# Patient Record
Sex: Male | Born: 1995 | Race: White | Hispanic: No | Marital: Single | Smoking: Former smoker
Health system: Southern US, Community
[De-identification: ages and names within clinical notes are randomized; demographics above are authoritative.]

## PROBLEM LIST (undated history)

## (undated) DIAGNOSIS — F32A Depression, unspecified: Secondary | ICD-10-CM

## (undated) DIAGNOSIS — F909 Attention-deficit hyperactivity disorder, unspecified type: Secondary | ICD-10-CM

## (undated) DIAGNOSIS — F329 Major depressive disorder, single episode, unspecified: Secondary | ICD-10-CM

## (undated) HISTORY — PX: EYE SURGERY: SHX253

---
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
## 2016-05-25 ENCOUNTER — Emergency Department (HOSPITAL_COMMUNITY)
Admission: EM | Admit: 2016-05-25 | Discharge: 2016-05-25 | Disposition: A | Discharge status: Home / Self Care | Payer: Medicaid Other | Attending: Emergency Medicine | Admitting: Emergency Medicine

## 2016-05-25 ENCOUNTER — Encounter (HOSPITAL_COMMUNITY): Payer: Self-pay | Admitting: Emergency Medicine

## 2016-05-25 DIAGNOSIS — R55 Syncope and collapse: Secondary | ICD-10-CM | POA: Insufficient documentation

## 2016-05-25 DIAGNOSIS — R9431 Abnormal electrocardiogram [ECG] [EKG]: Secondary | ICD-10-CM | POA: Insufficient documentation

## 2016-05-25 DIAGNOSIS — Z79899 Other long term (current) drug therapy: Secondary | ICD-10-CM | POA: Insufficient documentation

## 2016-05-25 DIAGNOSIS — F909 Attention-deficit hyperactivity disorder, unspecified type: Secondary | ICD-10-CM | POA: Insufficient documentation

## 2016-05-25 DIAGNOSIS — J029 Acute pharyngitis, unspecified: Secondary | ICD-10-CM | POA: Insufficient documentation

## 2016-05-25 DIAGNOSIS — Z791 Long term (current) use of non-steroidal anti-inflammatories (NSAID): Secondary | ICD-10-CM | POA: Insufficient documentation

## 2016-05-25 LAB — RAPID STREP SCREEN (MED CTR MEBANE ONLY): Streptococcus, Group A Screen (Direct): NEGATIVE

## 2016-05-25 MED ORDER — DEXAMETHASONE 4 MG PO TABS
12.0000 mg | ORAL_TABLET | Freq: Once | ORAL | Status: AC
  Administered 2016-05-25: 12 mg via ORAL
  Filled 2016-05-25: qty 3

## 2016-05-25 NOTE — ED Triage Notes (Signed)
Mother states he has episodes of near passing out. Pt state he will feel weak and dizzy before it happens

## 2016-05-25 NOTE — ED Triage Notes (Signed)
Sore throat x 1 week

## 2016-05-25 NOTE — ED Provider Notes (Signed)
[facility]EMER[facility]Y DEPT Provider Note   CSN: [id number] Arrival date & time: 05/25/16  1436  By signing my name below, I, Placido SouLogan Joldersma, attest that this documentation has been prepared under the direction and in the presence of Raeford RazorStephen Oryon Gary, MD. Electronically Signed: Placido SouLogan Joldersma, ED Scribe. 05/25/16. 5:40 PM.   History   Chief Complaint Chief Complaint  Patient presents with  . Sore Throat  . Near Syncope    HPI HPI Comments: Patrick Preston is a 20 y.o. male who presents to the Emergency Department complaining of constant, mild, sore throat x 1 week. Per mother, he is experiencing white spots on his posterior oropharynx, subjective fever, chills, mild productive cough with blood speckled sputum, as well as a moderate, diffuse, HA x 3 days. His sore throat worsens when swallowing. She states he was a h/o of dizziness with associated visual changes and diaphoresis when abruptly standing and will "lean against the wall for a minute until I get better". He states they are near syncopal and they are more frequent since the onset of his sore throat. He has taken tylenol, Advil and Delsym which provide mild short term relief of his symptoms. He denies appetite changes, nausea, vomiting, CP, back pain and ear pain.   The history is provided by the patient and a parent. No language interpreter was used.    Past Medical History:  Diagnosis Date  . ADHD (attention deficit hyperactivity disorder)   . Depression     There are no active problems to display for this patient.   Past Surgical History:  Procedure Laterality Date  . EYE SURGERY         Home Medications    Prior to Admission medications   Medication Sig Start Date End Date Taking? Authorizing Provider  amoxicillin (AMOXIL) 500 MG capsule Take 1 capsule (500 mg total) by mouth 3 (three) times daily. Patient not taking: Reported on 07/31/2014 06/29/13   Ivery QualeHobson Bryant, PA-C  ibuprofen (ADVIL,MOTRIN) 600 MG tablet Take 1  tablet (600 mg total) by mouth every 6 (six) hours as needed. 10/30/14   Ivery QualeHobson Bryant, PA-C  methylphenidate (CONCERTA) 36 MG CR tablet Take 36 mg by mouth every morning.     Historical Provider, MD  methylphenidate (CONCERTA) 54 MG CR tablet Take 54 mg by mouth every morning.    Historical Provider, MD  sulfamethoxazole-trimethoprim (BACTRIM DS,SEPTRA DS) 800-160 MG per tablet Take 1 tablet by mouth 2 (two) times daily.    Historical Provider, MD    Family History Family History  Problem Relation Age of Onset  . Rheum arthritis Mother   . Hypertension Mother   . Hyperlipidemia Mother   . Heart failure Other   . Diabetes Other   . Hypertension Other     Social History Social History  Substance Use Topics  . Smoking status: Never Smoker  . Smokeless tobacco: Never Used  . Alcohol use No     Allergies   Patient has no known allergies.   Review of Systems Review of Systems  Constitutional: Positive for chills, diaphoresis, fatigue and fever. Negative for appetite change.  HENT: Positive for sore throat. Negative for ear pain.   Eyes: Positive for visual disturbance.  Respiratory: Positive for cough.   Cardiovascular: Negative for chest pain.  Gastrointestinal: Negative for nausea and vomiting.  Musculoskeletal: Negative for back pain.  Neurological: Positive for dizziness and headaches. Negative for syncope.  All other systems reviewed and are negative.  Physical Exam Updated Vital Signs  BP 118/77   Pulse 87   Temp 98.9 F (37.2 C) (Oral)   Resp 14   Ht 6\' 2"  (1.88 m)   Wt 152 lb (68.9 kg)   SpO2 99%   BMI 19.52 kg/m   Physical Exam  Constitutional: He is oriented to person, place, and time. He appears well-developed and well-nourished.  HENT:  Head: Normocephalic and atraumatic.  Mouth/Throat: Uvula is midline. Tonsillar exudate.  Bilateral tonsillitis noted with exudates. Normal phonation.   Eyes: EOM are normal.  Neck: Normal range of motion. Neck supple.   Neck is supple w/o palpable nodes.   Cardiovascular: Normal rate, regular rhythm, normal heart sounds and intact distal pulses.   Pulmonary/Chest: Effort normal and breath sounds normal. No respiratory distress.  Abdominal: Soft. He exhibits no distension. There is no tenderness.  Musculoskeletal: Normal range of motion.  Lymphadenopathy:    He has no cervical adenopathy.  Neurological: He is alert and oriented to person, place, and time.  Skin: Skin is warm and dry.  Psychiatric: He has a normal mood and affect. Judgment normal.  Nursing note and vitals reviewed.  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 Confirmed by Juleen ChinaKOHUT  MD, Jeannett SeniorSTEPHEN [phone]([id number]) on 05/25/2016 5:56:22 PM       Radiology No results found.  Procedures Procedures  DIAGNOSTIC STUDIES: Oxygen Saturation is 99% on RA, normal by my interpretation.    COORDINATION OF CARE: 5:40 PM Discussed next steps with pt. Pt verbalized understanding and is agreeable with the plan.    Medications Ordered in ED Medications - No data to display   Initial Impression / Assessment and Plan / ED Course  I have reviewed the triage vital signs and the nursing notes.  Pertinent labs & imaging results that were available during my care of the patient were reviewed by me and considered in my medical decision making (see chart for details).  Clinical Course     20yM with exudative pharyngitis. Near syncopal symptoms. Seems most consistent with orthostasis in setting of  poor PO intake because of the sore throat.    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 No syncopal events. Currently no complaints aside from the sore throat. Discussed with cardiology. Will have him follow-up with cardiology/EP.   Final Clinical Impressions(s) / ED Diagnoses   Final diagnoses:  Pharyngitis, unspecified etiology  Near syncope  Abnormal EKG    New Prescriptions New Prescriptions   No medications on file     Raeford RazorStephen Marlyne Totaro, MD 06/04/16 1436

## 2016-05-28 LAB — CULTURE, GROUP A STREP (THRC)

## 2016-06-09 ENCOUNTER — Encounter: Payer: Self-pay | Admitting: Internal Medicine

## 2016-07-01 ENCOUNTER — Emergency Department (HOSPITAL_COMMUNITY)
Admission: EM | Admit: 2016-07-01 | Discharge: 2016-07-01 | Disposition: A | Discharge status: Home / Self Care | Payer: Self-pay | Attending: Emergency Medicine | Admitting: Emergency Medicine

## 2016-07-01 ENCOUNTER — Encounter (HOSPITAL_COMMUNITY): Payer: Self-pay | Admitting: *Deleted

## 2016-07-01 DIAGNOSIS — Y999 Unspecified external cause status: Secondary | ICD-10-CM | POA: Insufficient documentation

## 2016-07-01 DIAGNOSIS — Z79899 Other long term (current) drug therapy: Secondary | ICD-10-CM | POA: Insufficient documentation

## 2016-07-01 DIAGNOSIS — F909 Attention-deficit hyperactivity disorder, unspecified type: Secondary | ICD-10-CM | POA: Insufficient documentation

## 2016-07-01 DIAGNOSIS — Z791 Long term (current) use of non-steroidal anti-inflammatories (NSAID): Secondary | ICD-10-CM | POA: Insufficient documentation

## 2016-07-01 DIAGNOSIS — X788XXA Intentional self-harm by other sharp object, initial encounter: Secondary | ICD-10-CM | POA: Insufficient documentation

## 2016-07-01 DIAGNOSIS — Z7289 Other problems related to lifestyle: Secondary | ICD-10-CM

## 2016-07-01 DIAGNOSIS — Y939 Activity, unspecified: Secondary | ICD-10-CM | POA: Insufficient documentation

## 2016-07-01 DIAGNOSIS — S71112A Laceration without foreign body, left thigh, initial encounter: Secondary | ICD-10-CM | POA: Insufficient documentation

## 2016-07-01 DIAGNOSIS — Y929 Unspecified place or not applicable: Secondary | ICD-10-CM | POA: Insufficient documentation

## 2016-07-01 MED ORDER — LIDOCAINE-EPINEPHRINE (PF) 1 %-1:200000 IJ SOLN
INTRAMUSCULAR | Status: AC
  Filled 2016-07-01: qty 30

## 2016-07-01 NOTE — ED Triage Notes (Signed)
Pt has laceration to left upper thigh due to self inflicted wound with a razor blade; pt states it helps him feel better when he cuts himself

## 2016-07-01 NOTE — Discharge Instructions (Signed)
Suture removal in 10 days

## 2016-07-01 NOTE — ED Provider Notes (Signed)
[facility] Provider Note   CSN: [id number] Arrival date & time: 07/01/16  0105     History   Chief Complaint Chief Complaint  Patient presents with  . Laceration    HPI Patrick Preston is a 20 y.o. male.  HPI Patient presents with self-inflicted laceration of his anterior left thigh.  He states he's had a history of cutting behavior before in the past.  He is not currently seeing a psychiatrist or therapist.  No thoughts of self-harm.  He reports he was sad and tearful and this is what he does sometimes when he feels this way.  He's never tried to purposely harm himself.  He's cut his arms before in the past as well.  He reports this cut went deeper than he has intended   Past Medical History:  Diagnosis Date  . ADHD (attention deficit hyperactivity disorder)   . Depression     There are no active problems to display for this patient.   Past Surgical History:  Procedure Laterality Date  . EYE SURGERY         Home Medications    Prior to Admission medications   Medication Sig Start Date End Date Taking? Authorizing Provider  acetaminophen (TYLENOL) 500 MG tablet Take 500 mg by mouth every 6 (six) hours as needed for moderate pain.    Historical Provider, MD  dextromethorphan (DELSYM) 30 MG/5ML liquid Take 30 mg by mouth as needed for cough.    Historical Provider, MD  ibuprofen (ADVIL,MOTRIN) 600 MG tablet Take 1 tablet (600 mg total) by mouth every 6 (six) hours as needed. 10/30/14   Ivery QualeHobson Bryant, PA-C    Family History Family History  Problem Relation Age of Onset  . Rheum arthritis Mother   . Hypertension Mother   . Hyperlipidemia Mother   . Heart failure Other   . Diabetes Other   . Hypertension Other     Social History Social History  Substance Use Topics  . Smoking status: Never Smoker  . Smokeless tobacco: Never Used  . Alcohol use No     Allergies   Patient has no known allergies.   Review of Systems Review of Systems  All  other systems reviewed and are negative.    Physical Exam Updated Vital Signs BP 143/75 (BP Location: Right Arm)   Pulse 72   Temp 98.2 F (36.8 C) (Oral)   Resp 20   Ht 6\' 2"  (1.88 m)   Wt 139 lb (63 kg)   SpO2 100%   BMI 17.85 kg/m   Physical Exam  Constitutional: He is oriented to person, place, and time. He appears well-developed and well-nourished.  HENT:  Head: Normocephalic.  Eyes: EOM are normal.  Neck: Normal range of motion.  Pulmonary/Chest: Effort normal.  Abdominal: He exhibits no distension.  Musculoskeletal: Normal range of motion.  Superficial laceration of the mid anterior left thigh without active bleeding.  No signs of infection.  Neurological: He is alert and oriented to person, place, and time.  Psychiatric: He has a normal mood and affect.  No suicidal or homicidal ideation  Nursing note and vitals reviewed.    ED Treatments / Results  Labs (all labs ordered are listed, but only abnormal results are displayed) Labs Reviewed - No data to display  EKG  EKG Interpretation None       Radiology No results found.  Procedures Procedures (including critical care time)  ++++++++++++++++++++++++++++++++++++++++++++++++  LACERATION REPAIR Performed by: Lyanne CoAMPOS,Lister Brizzi M Consent: Verbal  consent obtained. Risks and benefits: risks, benefits and alternatives were discussed Patient identity confirmed: provided demographic data Time out performed prior to procedure Prepped and Draped in normal sterile fashion Wound explored Laceration Location: left thigh Laceration Length: 5cm No Foreign Bodies seen or palpated Anesthesia: local infiltration Local anesthetic: lidocaine 2% with epinephrine Anesthetic total: 5 ml Irrigation method: syringe Amount of cleaning: standard Skin closure: 3-0 prolene Number of sutures or staples: 7 Technique: running interlocked Patient tolerance: Patient tolerated the procedure well with no immediate  complications.  +++++++++++++++++++++++++++++++++++++++++++++++++++++  Medications Ordered in ED Medications  lidocaine-EPINEPHrine (XYLOCAINE-EPINEPHrine) 1 %-1:200000 (PF) injection (not administered)     Initial Impression / Assessment and Plan / ED Course  I have reviewed the triage vital signs and the nursing notes.  Pertinent labs & imaging results that were available during my care of the patient were reviewed by me and considered in my medical decision making (see chart for details).  Clinical Course     Infection warnings. Laceration repaired. Here with mother. No HI or SI.  This is cutting behavior. He will need outpatient mental health follow up  Final Clinical Impressions(s) / ED Diagnoses   Final diagnoses:  Laceration of left thigh, initial encounter  Deliberate self-cutting    New Prescriptions New Prescriptions   No medications on file     Azalia BilisKevin Elenor Wildes, MD 07/01/16 [phone]

## 2018-11-14 ENCOUNTER — Encounter (HOSPITAL_COMMUNITY): Payer: Self-pay

## 2018-11-14 ENCOUNTER — Other Ambulatory Visit: Payer: Self-pay

## 2018-11-14 ENCOUNTER — Emergency Department (HOSPITAL_COMMUNITY): Payer: Self-pay

## 2018-11-14 ENCOUNTER — Emergency Department (HOSPITAL_COMMUNITY)
Admission: EM | Admit: 2018-11-14 | Discharge: 2018-11-14 | Disposition: A | Discharge status: Home / Self Care | Payer: Self-pay | Attending: Emergency Medicine | Admitting: Emergency Medicine

## 2018-11-14 DIAGNOSIS — S51811A Laceration without foreign body of right forearm, initial encounter: Secondary | ICD-10-CM | POA: Insufficient documentation

## 2018-11-14 DIAGNOSIS — Y939 Activity, unspecified: Secondary | ICD-10-CM | POA: Insufficient documentation

## 2018-11-14 DIAGNOSIS — W25XXXA Contact with sharp glass, initial encounter: Secondary | ICD-10-CM | POA: Insufficient documentation

## 2018-11-14 DIAGNOSIS — Y929 Unspecified place or not applicable: Secondary | ICD-10-CM | POA: Insufficient documentation

## 2018-11-14 DIAGNOSIS — F17210 Nicotine dependence, cigarettes, uncomplicated: Secondary | ICD-10-CM | POA: Insufficient documentation

## 2018-11-14 DIAGNOSIS — Z23 Encounter for immunization: Secondary | ICD-10-CM | POA: Insufficient documentation

## 2018-11-14 DIAGNOSIS — Y999 Unspecified external cause status: Secondary | ICD-10-CM | POA: Insufficient documentation

## 2018-11-14 MED ORDER — TETANUS-DIPHTH-ACELL PERTUSSIS 5-2.5-18.5 LF-MCG/0.5 IM SUSP
0.5000 mL | Freq: Once | INTRAMUSCULAR | Status: AC
Start: 2018-11-14 — End: 2018-11-14
  Administered 2018-11-14: 0.5 mL via INTRAMUSCULAR
  Filled 2018-11-14: qty 0.5

## 2018-11-14 MED ORDER — POVIDONE-IODINE 10 % EX SOLN
CUTANEOUS | Status: AC
  Filled 2018-11-14: qty 15

## 2018-11-14 MED ORDER — LIDOCAINE HCL (PF) 1 % IJ SOLN
5.0000 mL | Freq: Once | INTRAMUSCULAR | Status: AC
  Administered 2018-11-14: 5 mL

## 2018-11-14 MED ORDER — ONDANSETRON 4 MG PO TBDP
4.0000 mg | ORAL_TABLET | Freq: Once | ORAL | Status: AC
  Administered 2018-11-14: 4 mg via ORAL
  Filled 2018-11-14: qty 1

## 2018-11-14 MED ORDER — LIDOCAINE HCL (PF) 2 % IJ SOLN
INTRAMUSCULAR | Status: AC
  Filled 2018-11-14: qty 10

## 2018-11-14 MED ORDER — POVIDONE-IODINE 10 % EX SOLN
CUTANEOUS | Status: DC | PRN
  Administered 2018-11-14: 13:00:00 via TOPICAL

## 2018-11-14 NOTE — Discharge Instructions (Addendum)
Have your sutures removed in 10 days.  Keep your wound clean and dry,  Until a good scab forms - you may then wash gently twice daily with mild soap and water, but dry completely after.  Get rechecked for any sign of infection (redness,  Swelling,  Increased pain or drainage of purulent fluid). ° °

## 2018-11-14 NOTE — ED Provider Notes (Addendum)
[facility] EMERGENCY DEPARTMENT Provider Note   CSN: [id number] Arrival date & time: 11/14/18  1159    History   Chief Complaint Chief Complaint  Patient presents with  . Laceration    HPI Patrick Preston is a 23 y.o. male.     The history is provided by the patient.  Laceration  Location:  Shoulder/arm Shoulder/arm laceration location:  R forearm Depth:  Through underlying tissue Quality: jagged   Bleeding: controlled   Time since incident:  1 hour Laceration mechanism:  Broken glass (Pt woke to mother and boyfriend arguing.  He became angry and tried to stop the yelling by his punching a glass table.  Glass broke and he cut his right forearm. ) Foreign body present:  Unable to specify Relieved by:  Pressure Worsened by:  Movement Ineffective treatments:  None tried Tetanus status:  Unknown Associated symptoms: no fever, no focal weakness, no numbness and no swelling    Pt does have a history of depression and prior incidents of cutting, he adamantly denies this with todays incident.   Past Medical History:  Diagnosis Date  . ADHD (attention deficit hyperactivity disorder)   . Depression     There are no active problems to display for this patient.   Past Surgical History:  Procedure Laterality Date  . EYE SURGERY          Home Medications    Prior to Admission medications   Medication Sig Start Date End Date Taking? Authorizing Provider  acetaminophen (TYLENOL) 500 MG tablet Take 500 mg by mouth every 6 (six) hours as needed for moderate pain.    [provider]  dextromethorphan (DELSYM) 30 MG/5ML liquid Take 30 mg by mouth as needed for cough.    [provider]  ibuprofen (ADVIL,MOTRIN) 600 MG tablet Take 1 tablet (600 mg total) by mouth every 6 (six) hours as needed. 10/30/14   Ivery Quale, PA-C    Family History Family History  Problem Relation Age of Onset  . Rheum arthritis Mother   . Hypertension Mother   .  Hyperlipidemia Mother   . Heart failure Other   . Diabetes Other   . Hypertension Other     Social History Social History   Tobacco Use  . Smoking status: Current Every Day Smoker    Types: Cigarettes  . Smokeless tobacco: Never Used  Substance Use Topics  . Alcohol use: Yes  . Drug use: No     Allergies   Patient has no known allergies.   Review of Systems Review of Systems  Constitutional: Negative for chills and fever.  Respiratory: Negative for shortness of breath and wheezing.   Musculoskeletal: Negative for arthralgias and joint swelling.  Skin: Positive for wound.  Neurological: Negative for focal weakness and numbness.     Physical Exam Updated Vital Signs BP 110/73 (BP Location: Left Arm)   Pulse 65   Temp 98.6 F (37 C) (Oral)   Resp 12   Ht 6' 2.5" (1.892 m)   Wt 69.9 kg   SpO2 100%   BMI 19.51 kg/m   Physical Exam Constitutional:      Appearance: He is well-developed.  HENT:     Head: Normocephalic.  Cardiovascular:     Rate and Rhythm: Normal rate.  Pulmonary:     Effort: Pulmonary effort is normal.  Musculoskeletal:        General: No swelling, tenderness or deformity.     Comments: Pt has full strength  right forearm, wrist, fingers without deficit.  Proximal belly of muscle appreciated with flex/ext of the 3rd and 4th fingers, ? Palmaris longus which appears intact.   Skin:    Findings: Laceration present.     Comments: 2 cm laceration upper volarright forearm.  Hemostatic. Through dermis. Muscle layer identified. No obvious muscle/tendon trauma.    Neurological:     Mental Status: He is alert and oriented to person, place, and time.     Sensory: No sensory deficit.  Psychiatric:        Attention and Perception: Attention normal.        Mood and Affect: Mood normal. Mood is not depressed. Affect is not flat or angry.        Speech: Speech normal.        Behavior: Behavior normal.        Thought Content: Thought content normal.         Judgment: Judgment normal.      ED Treatments / Results  Labs (all labs ordered are listed, but only abnormal results are displayed) Labs Reviewed - No data to display  EKG None  Radiology Dg Forearm Right  Result Date: 11/14/2018 CLINICAL DATA:  Punched glass table laceration mid right forearm. EXAM: RIGHT FOREARM - 2 VIEW COMPARISON:  None. FINDINGS: Soft tissue defect over the anterior ulnar aspect of the mid forearm compatible with soft tissue laceration. There is a 2-3 mm dense focus within the area of laceration seen only on the lateral film which could represent of small foreign body. Remainder of the exam is unremarkable. IMPRESSION: Soft tissue laceration over the anterior/ulnar aspect of the right mid forearm. Possible 2-3 mm foreign body over the area of laceration. Electronically Signed   By: Elberta Fortisaniel  Boyle M.D.   On: 11/14/2018 13:17    Procedures Procedures (including critical care time)  LACERATION REPAIR Performed by: Burgess AmorJulie Meko Masterson Authorized by: Burgess AmorJulie Lanore Renderos Consent: Verbal consent obtained. Risks and benefits: risks, benefits and alternatives were discussed Consent given by: patient Patient identity confirmed: provided demographic data Prepped and Draped in normal sterile fashion Wound explored  Laceration Location: right forearm  Laceration Length: 2 cm  No Foreign Bodies seen or palpated  Anesthesia: local infiltration  Local anesthetic: lidocaine 2% without epinephrine  Anesthetic total: 3 ml  Irrigation method: syringe Amount of cleaning: copious using NS.  Wound also probed to assess for possible retained fb.  Unable to see, feel or probe any objects.   Skin closure: fascia closure using vicryl 4-0,  Outer layer 4-0 ethilon   Number of sutures: #1 fascia, #5 surface   Technique: simple interrupted  Patient tolerance: Patient tolerated the procedure well with no immediate complications.   Medications Ordered in ED Medications  lidocaine  (XYLOCAINE) 2 % injection (has no administration in time range)  povidone-iodine (BETADINE) 10 % external solution ( Topical Given 11/14/18 1311)  ondansetron (ZOFRAN-ODT) disintegrating tablet 4 mg (4 mg Oral Given 11/14/18 1315)  lidocaine (PF) (XYLOCAINE) 1 % injection 5 mL (5 mLs Other Given 11/14/18 1312)  Tdap (BOOSTRIX) injection 0.5 mL (0.5 mLs Intramuscular Given 11/14/18 1435)     Initial Impression / Assessment and Plan / ED Course  I have reviewed the triage vital signs and the nursing notes.  Pertinent labs & imaging results that were available during my care of the patient were reviewed by me and considered in my medical decision making (see chart for details).        Wound care  instructions given.  Pt advised to have sutures removed in 10 days,  Return here sooner for any signs of infection including redness, swelling, worse pain or drainage of pus. Tetanus updated. Discussed with pt possibility of small retained fb. It is possible it flushed away with NS wash. Doubt he will have any sequalae if it is retained given small size.      Final Clinical Impressions(s) / ED Diagnoses   Final diagnoses:  Laceration of right forearm, initial encounter    ED Discharge Orders    None       Victoriano Lain 11/14/18 1440    Burgess Amor, PA-C 11/15/18 4098    Eber Hong, MD 11/15/18 [phone]

## 2018-11-14 NOTE — ED Triage Notes (Signed)
Pt presents to ED with laceration on right forearm. Pt states he broke a glass table on purpose and cut himself. Pt denies SI.

## 2020-08-05 ENCOUNTER — Other Ambulatory Visit: Payer: Self-pay

## 2020-08-05 ENCOUNTER — Encounter (HOSPITAL_COMMUNITY): Payer: Self-pay | Admitting: *Deleted

## 2020-08-05 ENCOUNTER — Emergency Department (HOSPITAL_COMMUNITY)
Admission: EM | Admit: 2020-08-05 | Discharge: 2020-08-05 | Disposition: A | Discharge status: Home / Self Care | Payer: Medicaid Other | Attending: Emergency Medicine | Admitting: Emergency Medicine

## 2020-08-05 DIAGNOSIS — F17210 Nicotine dependence, cigarettes, uncomplicated: Secondary | ICD-10-CM | POA: Insufficient documentation

## 2020-08-05 DIAGNOSIS — U071 COVID-19: Secondary | ICD-10-CM | POA: Insufficient documentation

## 2020-08-05 LAB — GROUP A STREP BY PCR: Group A Strep by PCR: NOT DETECTED

## 2020-08-05 LAB — POC SARS CORONAVIRUS 2 AG -  ED: SARS Coronavirus 2 Ag: POSITIVE — AB

## 2020-08-05 NOTE — ED Triage Notes (Signed)
Cough with sore throat.  

## 2020-08-05 NOTE — ED Notes (Signed)
Pt ambulatory to waiting room. Pt verbalized understanding of discharge instructions.   

## 2020-08-05 NOTE — ED Provider Notes (Signed)
[facility] EMERGENCY DEPARTMENT Provider Note   CSN: [id number] Arrival date & time: 08/05/20  1417     History No chief complaint on file.   Patrick Preston is a 25 y.o. male.  The history is provided by the patient. No language interpreter was used.  Cough Cough characteristics:  Non-productive Sputum characteristics:  Nondescript Severity:  Moderate Onset quality:  Gradual Duration:  7 days Timing:  Constant Progression:  Improving Chronicity:  New Smoker: no   Context: upper respiratory infection   Relieved by:  Nothing Worsened by:  Nothing Ineffective treatments:  None tried Risk factors: no recent infection        Past Medical History:  Diagnosis Date  . ADHD (attention deficit hyperactivity disorder)   . Depression     There are no problems to display for this patient.   Past Surgical History:  Procedure Laterality Date  . EYE SURGERY         Family History  Problem Relation Age of Onset  . Rheum arthritis Mother   . Hypertension Mother   . Hyperlipidemia Mother   . Heart failure Other   . Diabetes Other   . Hypertension Other     Social History   Tobacco Use  . Smoking status: Current Every Day Smoker    Types: Cigarettes  . Smokeless tobacco: Never Used  Vaping Use  . Vaping Use: Never used  Substance Use Topics  . Alcohol use: Yes  . Drug use: No    Home Medications Prior to Admission medications   Medication Sig Start Date End Date Taking? Authorizing Provider  acetaminophen (TYLENOL) 500 MG tablet Take 500 mg by mouth every 6 (six) hours as needed for moderate pain.    [provider]  dextromethorphan (DELSYM) 30 MG/5ML liquid Take 30 mg by mouth as needed for cough.    [provider]  ibuprofen (ADVIL,MOTRIN) 600 MG tablet Take 1 tablet (600 mg total) by mouth every 6 (six) hours as needed. 10/30/14   Ivery Quale, PA-C    Allergies    Patient has no known allergies.  Review of Systems   Review  of Systems  Respiratory: Positive for cough.   All other systems reviewed and are negative.   Physical Exam Updated Vital Signs BP 132/88 (BP Location: Right Arm)   Pulse 63   Temp 98.5 F (36.9 C) (Oral)   Resp 18   SpO2 100%   Physical Exam Vitals and nursing note reviewed.  Constitutional:      Appearance: He is well-developed and well-nourished.  HENT:     Head: Normocephalic.     Mouth/Throat:     Mouth: Mucous membranes are moist.  Eyes:     Extraocular Movements: EOM normal.     Pupils: Pupils are equal, round, and reactive to light.  Cardiovascular:     Rate and Rhythm: Normal rate and regular rhythm.  Pulmonary:     Effort: Pulmonary effort is normal.  Abdominal:     General: Abdomen is flat. There is no distension.  Musculoskeletal:        General: Normal range of motion.     Cervical back: Normal range of motion.  Neurological:     Mental Status: He is alert and oriented to person, place, and time.  Psychiatric:        Mood and Affect: Mood and affect and mood normal.     ED Results / Procedures / Treatments   Labs (all labs  ordered are listed, but only abnormal results are displayed) Labs Reviewed  POC SARS CORONAVIRUS 2 AG -  ED - Abnormal; Notable for the following components:      Result Value   SARS Coronavirus 2 Ag POSITIVE (*)    All other components within normal limits  GROUP A STREP BY PCR    EKG None  Radiology No results found.  Procedures Procedures   Medications Ordered in ED Medications - No data to display  ED Course  I have reviewed the triage vital signs and the nursing notes.  Pertinent labs & imaging results that were available during my care of the patient were reviewed by me and considered in my medical decision making (see chart for details).    MDM Rules/Calculators/A&P                          MDM:  Covid is positive.  Pt is improving but still feels bad.  Pt advised tylenol.  00w x 3 days  Final Clinical  Impression(s) / ED Diagnoses Final diagnoses:  COVID    Rx / DC Orders ED Discharge Orders    None    An After Visit Summary was printed and given to the patient.    Elson Areas, PA-C 08/05/20 1807    Vanetta Mulders, MD 08/11/20 [phone]

## 2020-09-25 ENCOUNTER — Encounter (HOSPITAL_COMMUNITY): Payer: Self-pay

## 2020-09-25 ENCOUNTER — Other Ambulatory Visit: Payer: Self-pay

## 2020-09-25 ENCOUNTER — Emergency Department (HOSPITAL_COMMUNITY): Payer: Worker's Compensation

## 2020-09-25 ENCOUNTER — Emergency Department (HOSPITAL_COMMUNITY)
Admission: EM | Admit: 2020-09-25 | Discharge: 2020-09-25 | Disposition: A | Discharge status: Home / Self Care | Payer: Worker's Compensation | Attending: Emergency Medicine | Admitting: Emergency Medicine

## 2020-09-25 DIAGNOSIS — Y990 Civilian activity done for income or pay: Secondary | ICD-10-CM | POA: Diagnosis not present

## 2020-09-25 DIAGNOSIS — S93491A Sprain of other ligament of right ankle, initial encounter: Secondary | ICD-10-CM

## 2020-09-25 DIAGNOSIS — F17210 Nicotine dependence, cigarettes, uncomplicated: Secondary | ICD-10-CM | POA: Insufficient documentation

## 2020-09-25 DIAGNOSIS — S99911A Unspecified injury of right ankle, initial encounter: Secondary | ICD-10-CM | POA: Diagnosis present

## 2020-09-25 DIAGNOSIS — X501XXA Overexertion from prolonged static or awkward postures, initial encounter: Secondary | ICD-10-CM | POA: Diagnosis not present

## 2020-09-25 NOTE — ED Triage Notes (Signed)
Pt was at work and jumped up to reach something, landing on his ankle the wrong way. Pt ambulatory to triage.

## 2020-09-25 NOTE — ED Provider Notes (Signed)
[facility] EMERGENCY DEPARTMENT Provider Note   CSN: [id number] Arrival date & time: 09/25/20  2024     History Chief Complaint  Patient presents with  . Ankle Pain    Right ankle     Patrick Preston is a 25 y.o. male who complains of inversion injury to the right ankle 2 day(s) ago. Immediate symptoms: immediate pain. Symptoms have been improving since that time. Prior history of related problems: no prior problems with this area in the past. There is pain and swelling at the lateral aspect of that ankle. He has been ambulatory   HPI     Past Medical History:  Diagnosis Date  . ADHD (attention deficit hyperactivity disorder)   . Depression     There are no problems to display for this patient.   Past Surgical History:  Procedure Laterality Date  . EYE SURGERY         Family History  Problem Relation Age of Onset  . Rheum arthritis Mother   . Hypertension Mother   . Hyperlipidemia Mother   . Heart failure Other   . Diabetes Other   . Hypertension Other     Social History   Tobacco Use  . Smoking status: Current Every Day Smoker    Types: Cigarettes  . Smokeless tobacco: Never Used  Vaping Use  . Vaping Use: Never used  Substance Use Topics  . Alcohol use: Yes  . Drug use: No    Home Medications Prior to Admission medications   Medication Sig Start Date End Date Taking? Authorizing Provider  acetaminophen (TYLENOL) 500 MG tablet Take 500 mg by mouth every 6 (six) hours as needed for moderate pain.    [provider]  dextromethorphan (DELSYM) 30 MG/5ML liquid Take 30 mg by mouth as needed for cough.    [provider]  ibuprofen (ADVIL,MOTRIN) 600 MG tablet Take 1 tablet (600 mg total) by mouth every 6 (six) hours as needed. 10/30/14   Ivery Quale, PA-C    Allergies    Patient has no known allergies.  Review of Systems   Review of Systems  Musculoskeletal: Positive for gait problem and joint swelling.  Neurological:  Negative for weakness and numbness.    Physical Exam Updated Vital Signs BP 131/74 (BP Location: Right Arm)   Pulse 62   Temp 98.5 F (36.9 C) (Oral)   Resp 18   Ht 6\' 2"  (1.88 m)   Wt 70.8 kg   SpO2 99%   BMI 20.03 kg/m   Physical Exam Vitals and nursing note reviewed.  Constitutional:      General: He is not in acute distress.    Appearance: He is well-developed. He is not diaphoretic.  HENT:     Head: Normocephalic and atraumatic.  Eyes:     General: No scleral icterus.    Conjunctiva/sclera: Conjunctivae normal.  Cardiovascular:     Rate and Rhythm: Normal rate and regular rhythm.     Heart sounds: Normal heart sounds.  Pulmonary:     Effort: Pulmonary effort is normal. No respiratory distress.     Breath sounds: Normal breath sounds.  Abdominal:     Palpations: Abdomen is soft.     Tenderness: There is no abdominal tenderness.  Musculoskeletal:     Cervical back: Normal range of motion and neck supple.     Comments: Exam of the injured ankle reveals swelling and tenderness over the lateral malleolus. No tenderness over the medial aspect of the  ankle. The fifth metatarsal is not tender. The ankle joint is intact without excessive opening on stressing.  The rest of the foot, ankle and leg exam is normal.   Skin:    General: Skin is warm and dry.  Neurological:     Mental Status: He is alert.  Psychiatric:        Behavior: Behavior normal.     ED Results / Procedures / Treatments   Labs (all labs ordered are listed, but only abnormal results are displayed) Labs Reviewed - No data to display  EKG None  Radiology No results found.  Procedures Procedures   Medications Ordered in ED Medications - No data to display  ED Course  I have reviewed the triage vital signs and the nursing notes.  Pertinent labs & imaging results that were available during my care of the patient were reviewed by me and considered in my medical decision making (see chart for  details).    MDM Rules/Calculators/A&P                          Patient X-Ray negative for obvious fracture or dislocation. Pain managed in ED.   Home Care: Rest and elevate the injured ankle, apply ice intermittently. Use crutches without weight bearing until able to comfortable bear partial weight, then progress to full weight bearing as tolerated. Splint applied. See ortho prn.  Patient will be dc home & is agreeable with above plan.  Final Clinical Impression(s) / ED Diagnoses Final diagnoses:  None    Rx / DC Orders ED Discharge Orders    None       Arthor Captain, PA-C 09/25/20 2202    Derwood Kaplan, MD 09/25/20 2248

## 2020-09-25 NOTE — ED Notes (Signed)
Pt in bed, pt states that he doesn't want anything for pain at this time, verbalized understanding ace wrap use, states that he is ready to go home, work note given

## 2020-09-25 NOTE — ED Notes (Signed)
Ace wrap placer per provider instructions.

## 2020-09-25 NOTE — ED Notes (Signed)
Pt states that he rolled his ankle today at work when he was putting something on the shelf, pt has some bruising and swelling to R ankle, less than three sec cap refill, positive sensation to touch, pt refused ice pack

## 2020-09-25 NOTE — Discharge Instructions (Signed)
Contact a health care provider if: °You have rapidly increasing bruising or swelling. °Your pain is not relieved with medicine. °Get help right away if: °Your foot or toes become numb or blue. °You have severe pain that gets worse. °

## 2021-11-04 ENCOUNTER — Ambulatory Visit
Admission: EM | Admit: 2021-11-04 | Discharge: 2021-11-04 | Disposition: A | Discharge status: Home / Self Care | Payer: No Typology Code available for payment source | Attending: Family Medicine | Admitting: Family Medicine

## 2021-11-04 DIAGNOSIS — J029 Acute pharyngitis, unspecified: Secondary | ICD-10-CM | POA: Insufficient documentation

## 2021-11-04 DIAGNOSIS — Z20828 Contact with and (suspected) exposure to other viral communicable diseases: Secondary | ICD-10-CM | POA: Diagnosis not present

## 2021-11-04 DIAGNOSIS — J069 Acute upper respiratory infection, unspecified: Secondary | ICD-10-CM | POA: Diagnosis not present

## 2021-11-04 LAB — POCT RAPID STREP A (OFFICE): Rapid Strep A Screen: NEGATIVE

## 2021-11-04 MED ORDER — LIDOCAINE VISCOUS HCL 2 % MT SOLN: 10.0000 mL | OROMUCOSAL | 0 refills | Status: AC | PRN

## 2021-11-04 MED ORDER — PROMETHAZINE-DM 6.25-15 MG/5ML PO SYRP: 5.0000 mL | ORAL_SOLUTION | Freq: Four times a day (QID) | ORAL | 0 refills | Status: AC | PRN

## 2021-11-04 NOTE — ED Triage Notes (Signed)
Pt states that since Sunday he has had a scratchy throat and a swollen place in his mouth ? ?Pt states he is having chest and back pain from coughing since Monday ? ?Denies Meds ? ?Denies fever ?

## 2021-11-04 NOTE — ED Provider Notes (Signed)
?[facility] ? ? ? ?CSN: [id number] ?Arrival date & time: 11/04/21  1434 ? ? ?  ? ?History   ?Chief Complaint ?Chief Complaint  ?Patient presents with  ? Sore Throat  ? ? ?HPI ?Patrick Preston is a 26 y.o. male.  ? ?Presenting today with 2-day history of sore throat, congestion, chest tightness when coughing.  Denies fever, chills, chest pain, body aches, nausea vomiting or diarrhea.  So far not tried any medications for symptoms.  No known sick contacts recently.  No known history of pertinent chronic medical problems. ? ?Past Medical History:  ?Diagnosis Date  ? ADHD (attention deficit hyperactivity disorder)   ? Depression   ? ?There are no problems to display for this patient. ? ? ?Past Surgical History:  ?Procedure Laterality Date  ? EYE SURGERY    ? ? ? ? ? ?Home Medications   ? ?Prior to Admission medications   ?Medication Sig Start Date End Date Taking? Authorizing Provider  ?lidocaine (XYLOCAINE) 2 % solution Use as directed 10 mLs in the mouth or throat every 3 (three) hours as needed for mouth pain. 11/04/21  Yes Particia Nearing, PA-C  ?promethazine-dextromethorphan (PROMETHAZINE-DM) 6.25-15 MG/5ML syrup Take 5 mLs by mouth 4 (four) times daily as needed. 11/04/21  Yes Particia Nearing, PA-C  ?acetaminophen (TYLENOL) 500 MG tablet Take 500 mg by mouth every 6 (six) hours as needed for moderate pain.    [provider]  ?dextromethorphan (DELSYM) 30 MG/5ML liquid Take 30 mg by mouth as needed for cough.    [provider]  ?ibuprofen (ADVIL,MOTRIN) 600 MG tablet Take 1 tablet (600 mg total) by mouth every 6 (six) hours as needed. 10/30/14   Ivery Quale, PA-C  ? ? ?Family History ?Family History  ?Problem Relation Age of Onset  ? Rheum arthritis Mother   ? Hypertension Mother   ? Hyperlipidemia Mother   ? Heart failure Other   ? Diabetes Other   ? Hypertension Other   ? ? ?Social History ?Social History  ? ?Tobacco Use  ? Smoking status: Former  ?  Types: Cigarettes   ? Smokeless tobacco: Never  ?Vaping Use  ? Vaping Use: Never used  ?Substance Use Topics  ? Alcohol use: Yes  ? Drug use: No  ? ? ? ?Allergies   ?Patient has no known allergies. ? ? ?Review of Systems ?Review of Systems ?Per HPI ? ?Physical Exam ?Triage Vital Signs ?ED Triage Vitals  ?Enc Vitals Group  ?   BP 11/04/21 1643 138/77  ?   Pulse Rate 11/04/21 1643 80  ?   Resp 11/04/21 1643 20  ?   Temp 11/04/21 1643 98.5 ?F (36.9 ?C)  ?   Temp Source 11/04/21 1643 Oral  ?   SpO2 11/04/21 1643 97 %  ?   Weight --   ?   Height --   ?   Head Circumference --   ?   Peak Flow --   ?   Pain Score 11/04/21 1645 5  ?   Pain Loc --   ?   Pain Edu? --   ?   Excl. in GC? --   ? ?No data found. ? ?Updated Vital Signs ?BP 138/77 (BP Location: Right Arm)   Pulse 80   Temp 98.5 ?F (36.9 ?C) (Oral)   Resp 20   SpO2 97%  ? ?Visual Acuity ?Right Eye Distance:   ?Left Eye Distance:   ?Bilateral Distance:   ? ?  Right Eye Near:   ?Left Eye Near:    ?Bilateral Near:    ? ?Physical Exam ?Vitals and nursing note reviewed.  ?Constitutional:   ?   Appearance: Normal appearance. He is well-developed.  ?HENT:  ?   Head: Atraumatic.  ?   Right Ear: External ear normal.  ?   Left Ear: External ear normal.  ?   Mouth/Throat:  ?   Mouth: Mucous membranes are moist.  ?   Pharynx: Oropharyngeal exudate and posterior oropharyngeal erythema present.  ?   Comments: Mild tonsillar erythema, edema, right-sided exudates ?Eyes:  ?   Extraocular Movements: Extraocular movements intact.  ?   Conjunctiva/sclera: Conjunctivae normal.  ?   Pupils: Pupils are equal, round, and reactive to light.  ?Cardiovascular:  ?   Rate and Rhythm: Normal rate and regular rhythm.  ?Pulmonary:  ?   Effort: Pulmonary effort is normal. No respiratory distress.  ?   Breath sounds: Normal breath sounds. No rales.  ?Musculoskeletal:     ?   General: Normal range of motion.  ?   Cervical back: Normal range of motion and neck supple.  ?Lymphadenopathy:  ?   Cervical: No cervical  adenopathy.  ?Skin: ?   General: Skin is warm and dry.  ?Neurological:  ?   General: No focal deficit present.  ?   Mental Status: He is alert and oriented to person, place, and time.  ?Psychiatric:     ?   Mood and Affect: Mood normal.     ?   Behavior: Behavior normal.     ?   Thought Content: Thought content normal.     ?   Judgment: Judgment normal.  ? ? ? ?UC Treatments / Results  ?Labs ?(all labs ordered are listed, but only abnormal results are displayed) ?Labs Reviewed  ?COVID-19, FLU A+B NAA  ?CULTURE, GROUP A STREP [facility])  ?POCT RAPID STREP A (OFFICE)  ? ? ?EKG ? ? ?Radiology ?No results found. ? ?Procedures ?Procedures (including critical care time) ? ?Medications Ordered in UC ?Medications - No data to display ? ?Initial Impression / Assessment and Plan / UC Course  ?I have reviewed the triage vital signs and the nursing notes. ? ?Pertinent labs & imaging results that were available during my care of the patient were reviewed by me and considered in my medical decision making (see chart for details). ? ?  ? ?Vital signs benign and reassuring, rapid strep negative, throat culture and COVID and flu test pending.  We will treat with viscous lidocaine, Phenergan DM and supportive over-the-counter medications and home care while awaiting remainder of results.  Adjust as needed based on these.  Work note given.  Return for any acutely worsening symptoms. ? ?Final Clinical Impressions(s) / UC Diagnoses  ? ?Final diagnoses:  ?Exposure to the flu  ?Sore throat  ?Viral URI with cough  ? ?Discharge Instructions   ?None ?  ? ?ED Prescriptions   ? ? Medication Sig Dispense Auth. Provider  ? lidocaine (XYLOCAINE) 2 % solution Use as directed 10 mLs in the mouth or throat every 3 (three) hours as needed for mouth pain. 100 mL Particia Nearing, PA-C  ? promethazine-dextromethorphan (PROMETHAZINE-DM) 6.25-15 MG/5ML syrup Take 5 mLs by mouth 4 (four) times daily as needed. 100 mL Particia Nearing, [place]  ? ?   ? ?PDMP not reviewed this encounter. ?  ?Particia Nearing, PA-C ?11/04/21 1749 ? ?

## 2021-11-05 LAB — COVID-19, FLU A+B NAA
Influenza A, NAA: NOT DETECTED
Influenza B, NAA: NOT DETECTED
SARS-CoV-2, NAA: NOT DETECTED

## 2021-11-07 LAB — CULTURE, GROUP A STREP (THRC)

## 2021-12-26 ENCOUNTER — Encounter (HOSPITAL_COMMUNITY): Payer: Self-pay

## 2021-12-26 ENCOUNTER — Other Ambulatory Visit: Payer: Self-pay

## 2021-12-26 ENCOUNTER — Emergency Department (HOSPITAL_COMMUNITY)
Admission: EM | Admit: 2021-12-26 | Discharge: 2021-12-26 | Disposition: A | Discharge status: Home / Self Care | Payer: No Typology Code available for payment source | Attending: Emergency Medicine | Admitting: Emergency Medicine

## 2021-12-26 DIAGNOSIS — R059 Cough, unspecified: Secondary | ICD-10-CM | POA: Insufficient documentation

## 2021-12-26 DIAGNOSIS — Z20822 Contact with and (suspected) exposure to covid-19: Secondary | ICD-10-CM | POA: Diagnosis not present

## 2021-12-26 DIAGNOSIS — R051 Acute cough: Secondary | ICD-10-CM

## 2021-12-26 LAB — POC SARS CORONAVIRUS 2 AG -  ED: SARSCOV2ONAVIRUS 2 AG: NEGATIVE

## 2021-12-26 MED ORDER — BENZONATATE 100 MG PO CAPS: 100.0000 mg | ORAL_CAPSULE | Freq: Three times a day (TID) | ORAL | 0 refills | Status: AC

## 2022-05-15 IMAGING — DX DG ANKLE COMPLETE 3+V*R*
3 series · 3 of 3 positions shown · non-contrast
Comparison: None.

CLINICAL DATA: 24-year-old male with trauma to the right ankle.

EXAM:
RIGHT ANKLE - COMPLETE 3+ VIEW

[ankle ap]
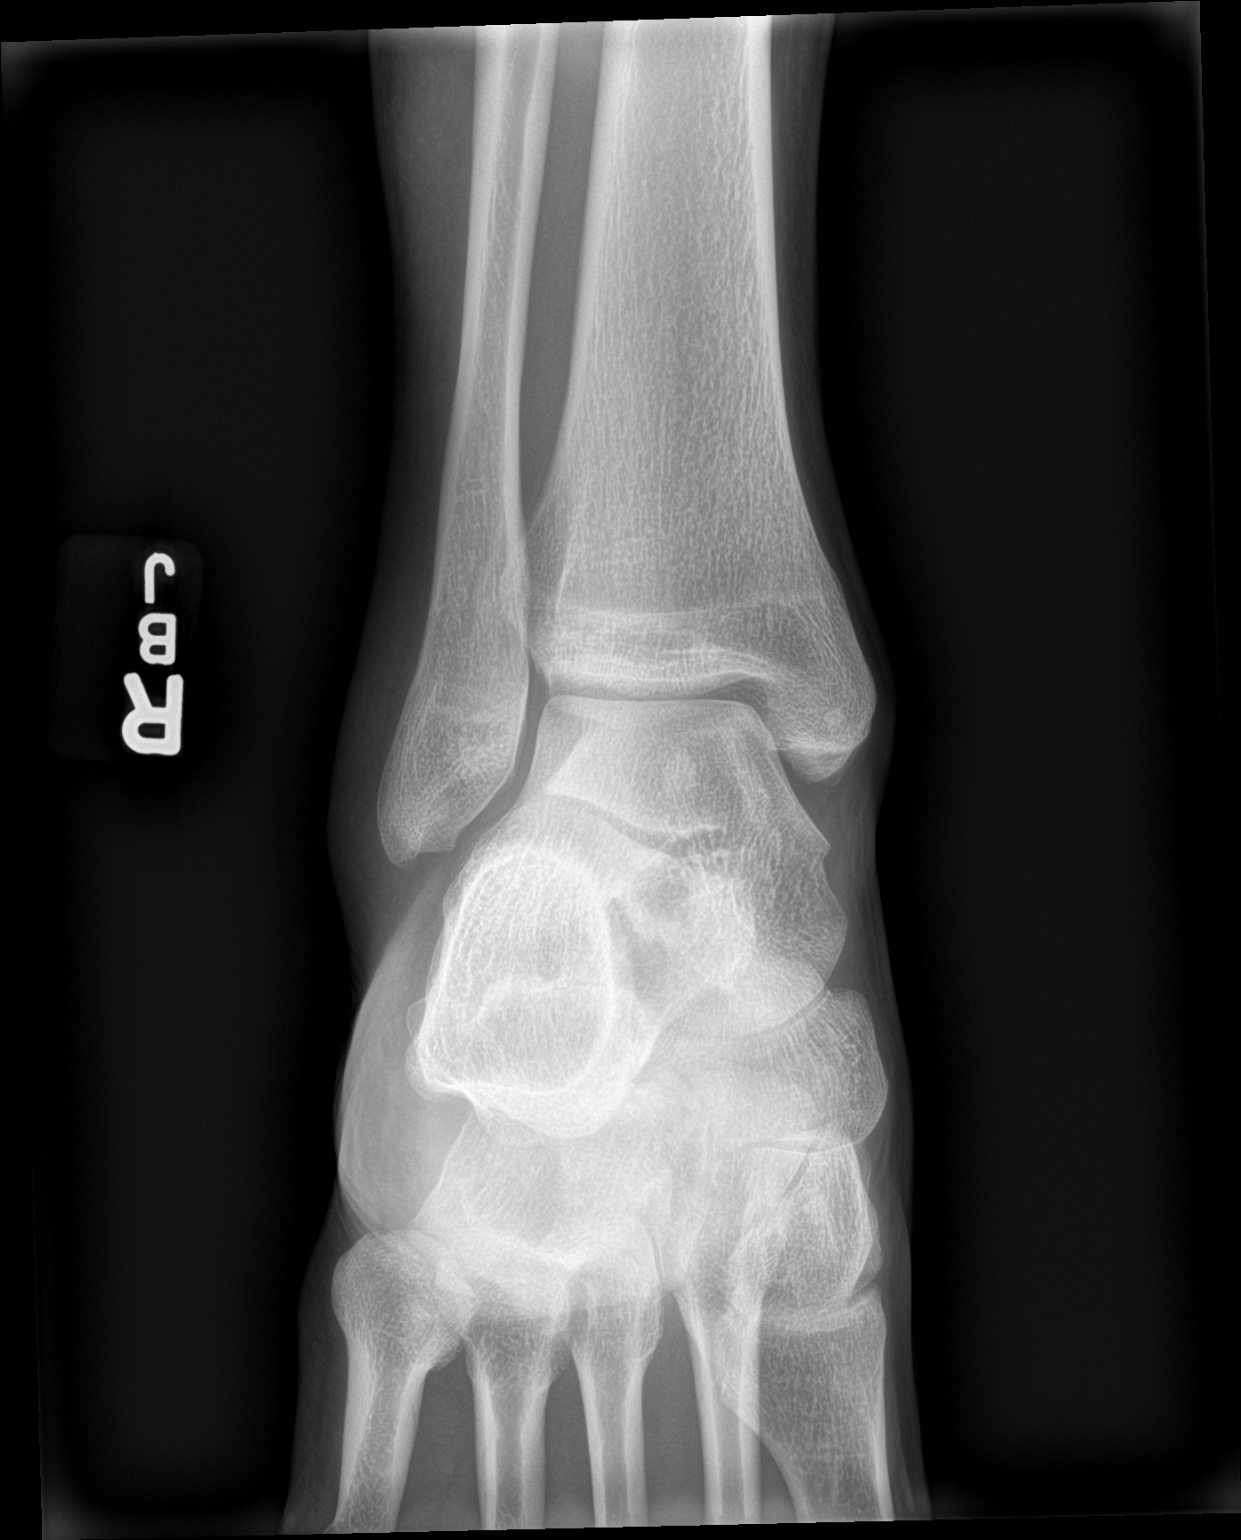

[ankle obl]
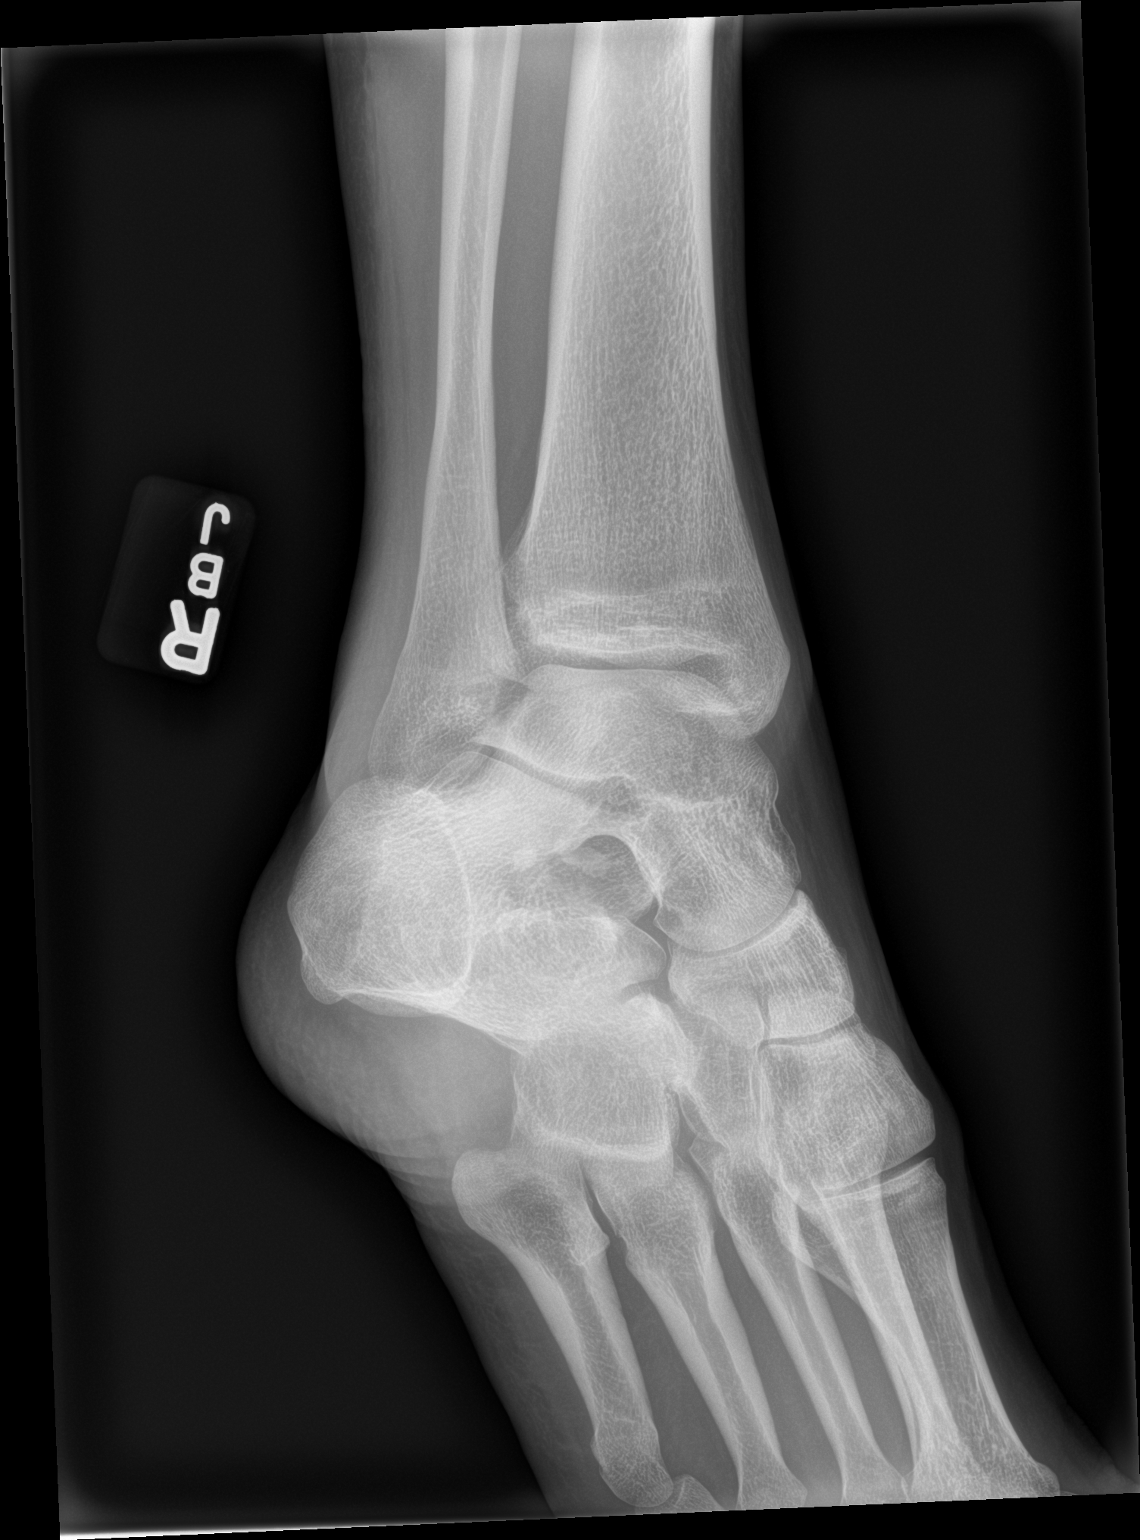

[ankle lat]
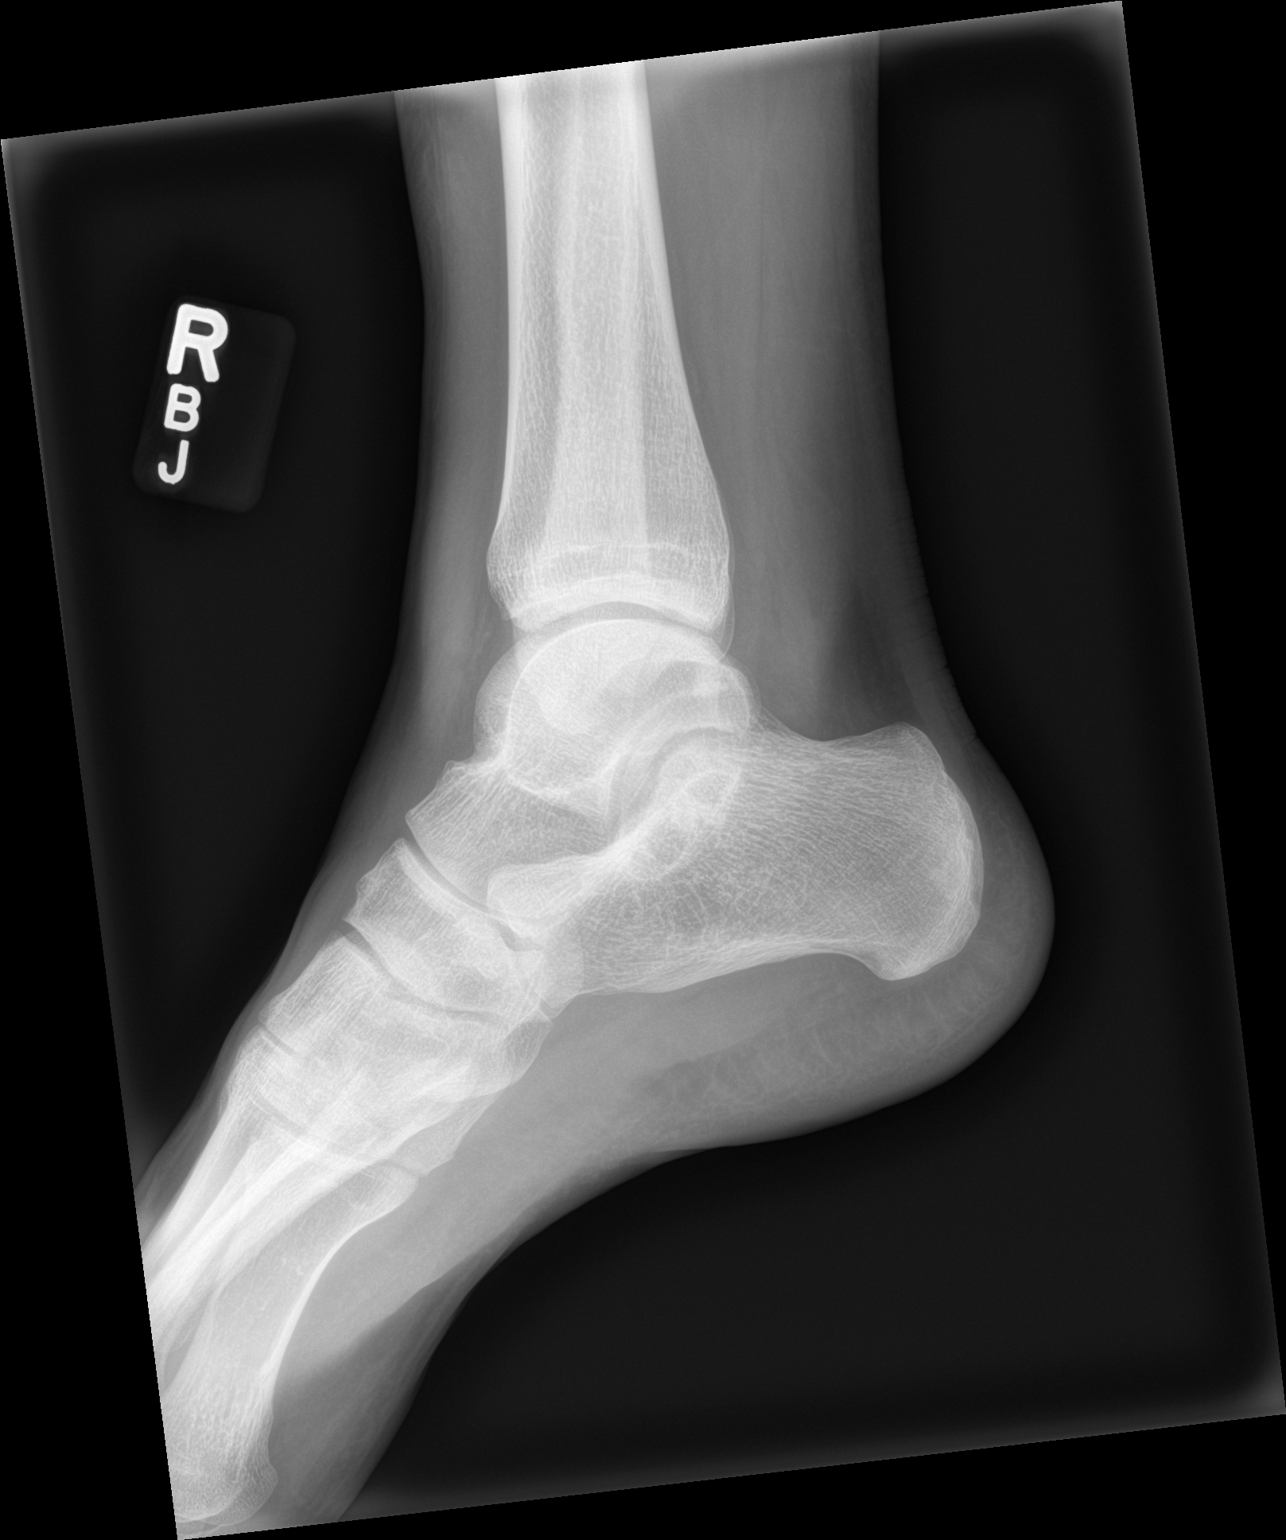

[3 of 3 positions shown; findings below may reference images not displayed]

FINDINGS: There is no evidence of fracture, dislocation, or joint effusion.
There is no evidence of arthropathy or other focal bone abnormality.
Soft tissues are unremarkable.
IMPRESSION: Negative.

## 2024-01-13 ENCOUNTER — Encounter (HOSPITAL_COMMUNITY): Payer: Self-pay

## 2024-01-13 ENCOUNTER — Emergency Department (HOSPITAL_COMMUNITY)

## 2024-01-13 ENCOUNTER — Emergency Department (HOSPITAL_COMMUNITY)
Admission: EM | Admit: 2024-01-13 | Discharge: 2024-01-13 | Disposition: A | Discharge status: Home / Self Care | Attending: Emergency Medicine | Admitting: Emergency Medicine

## 2024-01-13 ENCOUNTER — Other Ambulatory Visit: Payer: Self-pay

## 2024-01-13 DIAGNOSIS — Z23 Encounter for immunization: Secondary | ICD-10-CM | POA: Insufficient documentation

## 2024-01-13 DIAGNOSIS — S61011A Laceration without foreign body of right thumb without damage to nail, initial encounter: Secondary | ICD-10-CM | POA: Diagnosis not present

## 2024-01-13 DIAGNOSIS — W268XXA Contact with other sharp object(s), not elsewhere classified, initial encounter: Secondary | ICD-10-CM | POA: Diagnosis not present

## 2024-01-13 DIAGNOSIS — S6991XA Unspecified injury of right wrist, hand and finger(s), initial encounter: Secondary | ICD-10-CM | POA: Diagnosis present

## 2024-01-13 DIAGNOSIS — S61001A Unspecified open wound of right thumb without damage to nail, initial encounter: Secondary | ICD-10-CM

## 2024-01-13 MED ORDER — TETANUS-DIPHTH-ACELL PERTUSSIS 5-2.5-18.5 LF-MCG/0.5 IM SUSY
0.5000 mL | PREFILLED_SYRINGE | Freq: Once | INTRAMUSCULAR | Status: AC
  Administered 2024-01-13: 0.5 mL via INTRAMUSCULAR
  Filled 2024-01-13: qty 0.5

## 2024-01-13 MED ORDER — CEPHALEXIN 500 MG PO CAPS
500.0000 mg | ORAL_CAPSULE | Freq: Once | ORAL | Status: AC
  Administered 2024-01-13: 500 mg via ORAL
  Filled 2024-01-13: qty 1

## 2024-01-13 MED ORDER — CEPHALEXIN 500 MG PO CAPS: 500.0000 mg | ORAL_CAPSULE | Freq: Once | ORAL | Status: DC

## 2024-01-13 MED ORDER — CEPHALEXIN 500 MG PO CAPS: 500.0000 mg | ORAL_CAPSULE | Freq: Three times a day (TID) | ORAL | 0 refills | Status: AC

## 2024-01-13 MED ORDER — LIDOCAINE HCL (PF) 1 % IJ SOLN
30.0000 mL | Freq: Once | INTRAMUSCULAR | Status: AC
Start: 2024-01-13 — End: 2024-01-13
  Administered 2024-01-13: 30 mL
  Filled 2024-01-13: qty 30

## 2024-01-13 NOTE — ED Provider Notes (Signed)
 [facility] AT [facility] Provider Note   CSN: [id number] Arrival date & time: 01/13/24  1715     Patient presents with: Laceration   Patrick Preston is a 28 y.o. male.  Right-hand-dominant, denies significant PMH.  Presents ER for right thumb laceration.  He states it is bleeding for about 4 hours.  He was reaching under a metal shelf and the edge of the shelf cut his finger.  He tried applying pressure but it continues to bleed.  He is not on blood thinners.  He has mild pain at the site but no other complaints.  Unsure of the date of his last tetanus.    Laceration      Prior to Admission medications   Medication Sig Start Date End Date Taking? Authorizing Provider  acetaminophen (TYLENOL) 500 MG tablet Take 500 mg by mouth every 6 (six) hours as needed for moderate pain.    [provider]  benzonatate  (TESSALON ) 100 MG capsule Take 1 capsule (100 mg total) by mouth every 8 (eight) hours. 12/26/21   Emelia Sluder, PA-C  dextromethorphan (DELSYM) 30 MG/5ML liquid Take 30 mg by mouth as needed for cough.    [provider]  ibuprofen  (ADVIL ,MOTRIN ) 600 MG tablet Take 1 tablet (600 mg total) by mouth every 6 (six) hours as needed. 10/30/14   Armida Culver, PA-C  lidocaine  (XYLOCAINE ) 2 % solution Use as directed 10 mLs in the mouth or throat every 3 (three) hours as needed for mouth pain. 11/04/21   Stuart Vernell Norris, PA-C  promethazine -dextromethorphan (PROMETHAZINE -DM) 6.25-15 MG/5ML syrup Take 5 mLs by mouth 4 (four) times daily as needed. 11/04/21   Stuart Vernell Norris, PA-C    Allergies: Patient has no known allergies.    Review of Systems  Updated Vital Signs BP 138/86 (BP Location: Right Arm)   Pulse 60   Temp 98.2 F (36.8 C)   Resp 16   Ht 6' 2 (1.88 m)   Wt 77.1 kg   SpO2 90%   BMI 21.83 kg/m   Physical Exam Vitals and nursing note reviewed.  Constitutional:      General: He is not in acute distress.     Appearance: He is well-developed.  HENT:     Head: Normocephalic and atraumatic.     Mouth/Throat:     Mouth: Mucous membranes are moist.  Eyes:     Extraocular Movements: Extraocular movements intact.     Conjunctiva/sclera: Conjunctivae normal.     Pupils: Pupils are equal, round, and reactive to light.  Cardiovascular:     Rate and Rhythm: Normal rate and regular rhythm.     Heart sounds: No murmur heard. Pulmonary:     Effort: Pulmonary effort is normal. No respiratory distress.     Breath sounds: Normal breath sounds.  Abdominal:     Palpations: Abdomen is soft.     Tenderness: There is no abdominal tenderness.  Musculoskeletal:        General: No swelling.     Cervical back: Neck supple.  Skin:    General: Skin is warm and dry.     Capillary Refill: Capillary refill takes less than 2 seconds.     Comments: Approximately 0.5 cm soft tissue avulsion to the ulnar aspect of the right thumb with bleeding.  Full range of motion with flexion extension of the thumb without difficulty.  Capillary fill is brisk.  There is constant venous oozing noted.  Neurological:  General: No focal deficit present.     Mental Status: He is alert and oriented to person, place, and time.  Psychiatric:        Mood and Affect: Mood normal.     (all labs ordered are listed, but only abnormal results are displayed) Labs Reviewed - No data to display  EKG: None  Radiology: DG Finger Thumb Right Result Date: 01/13/2024 CLINICAL DATA:  injury, laceration EXAM: RIGHT THUMB 2+V COMPARISON:  None Available. FINDINGS: No acute fracture or dislocation. There is no evidence of arthropathy or other focal bone abnormality. Overlying bandage material noted. No radiopaque foreign body. IMPRESSION: No acute fracture or dislocation. Electronically Signed   By: Rogelia Myers M.D.   On: 01/13/2024 17:48     Procedures   Medications Ordered in the ED - No data to display                                   Medical Decision Making Differential diagnosis includes but not limited to laceration, skin avulsion, infection, other  ED course: Patient has a soft tissue avulsion, x-ray of the right thumb shows no fracture, dislocation or foreign body.  He has normal range of motion with no signs of tendon injury.  Unfortunately bleeding is not controlled with pressure so this was cleaned and dressed with quick clot, and gauze.  Patient tolerated this well, he is observed for about 30 minutes and had no further bleeding.  Advised to give this on until tomorrow and then wash gently.  This was fairly deep, unfortunately not repairable with sutures, will treat with antibiotics for infection prophylaxis.  Updated today.  Amount and/or Complexity of Data Reviewed Radiology: ordered and independent interpretation performed.    Details: Right thumb x-ray no fracture, dislocation or foreign body, I agree with radiology read  Risk Prescription drug management.        Final diagnoses:  None    ED Discharge Orders     None          Suellen Sherran DELENA DEVONNA 01/13/24 2024    Cleotilde Rogue, MD 01/14/24 [phone]

## 2024-01-13 NOTE — Discharge Instructions (Addendum)
 Today for right, on your skin.  Unfortunately you fully cut the skin off and there is no way to suture this.  So we cleaned it and put wound seal on to stop the bleeding and have dressed it.  Keep the area dressed for 24 hours, tomorrow he can take it off and gently wash and dry it.  Follow-up for wound check in 2 to 3 days with your PCP.  I am going to start you on antibiotics for a couple of days to prevent risk of infection.  Come back to the ER for redness, fever or drainage.

## 2024-01-13 NOTE — ED Triage Notes (Signed)
 Pt arrived via POV c/o right thumb laceration that occurred when he scrapped his right thumb under a piece of metal shelving.
